# Patient Record
Sex: Female | Born: 1980 | Race: Black or African American | Hispanic: No | Marital: Single | Smoking: Never smoker
Health system: Southern US, Community
[De-identification: ages and names within clinical notes are randomized; demographics above are authoritative.]

---

## 2009-04-21 ENCOUNTER — Emergency Department (HOSPITAL_COMMUNITY): Admission: EM | Admit: 2009-04-21 | Discharge: 2009-04-21 | Payer: Self-pay | Admitting: Emergency Medicine

## 2010-05-06 IMAGING — CR DG CHEST 2V
2 series · 2 of 2 positions shown · non-contrast
Comparison: None

CLINICAL DATA: Left chest pain

CHEST - 2 VIEW

[view not recorded (1 of 2)]
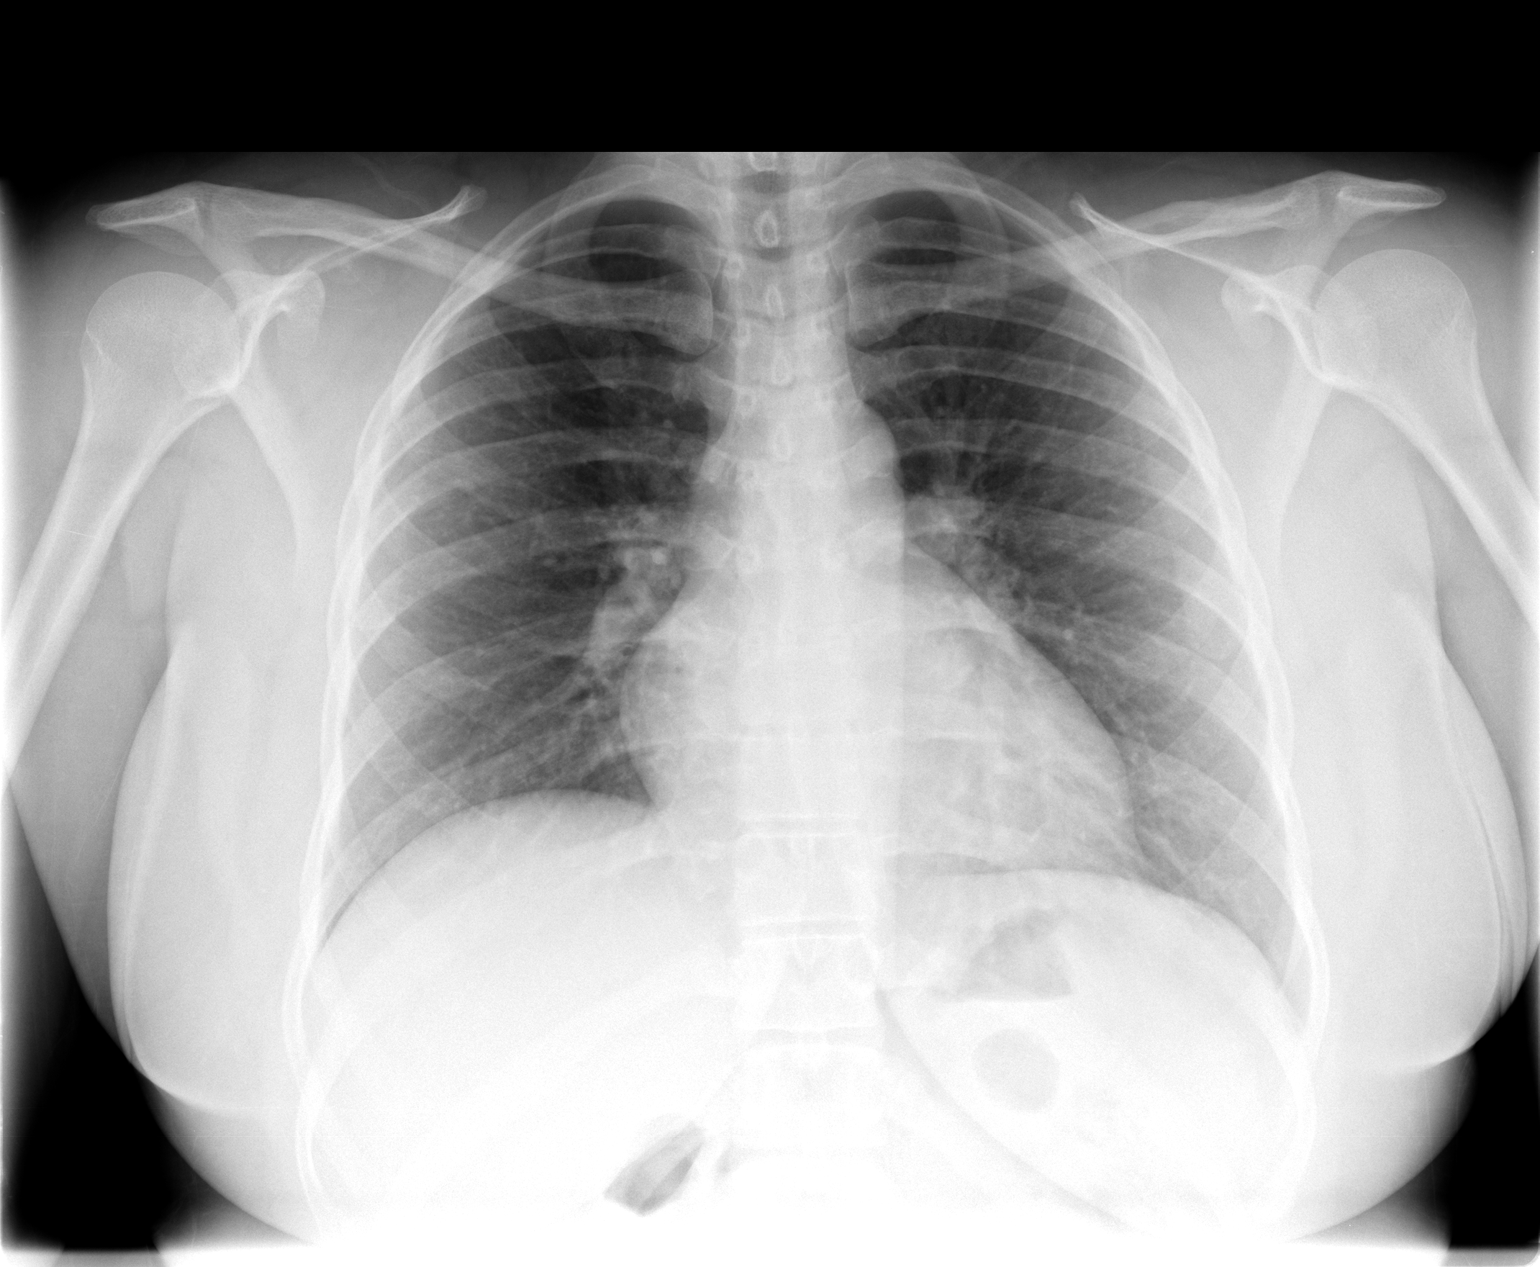

[view not recorded (2 of 2)]
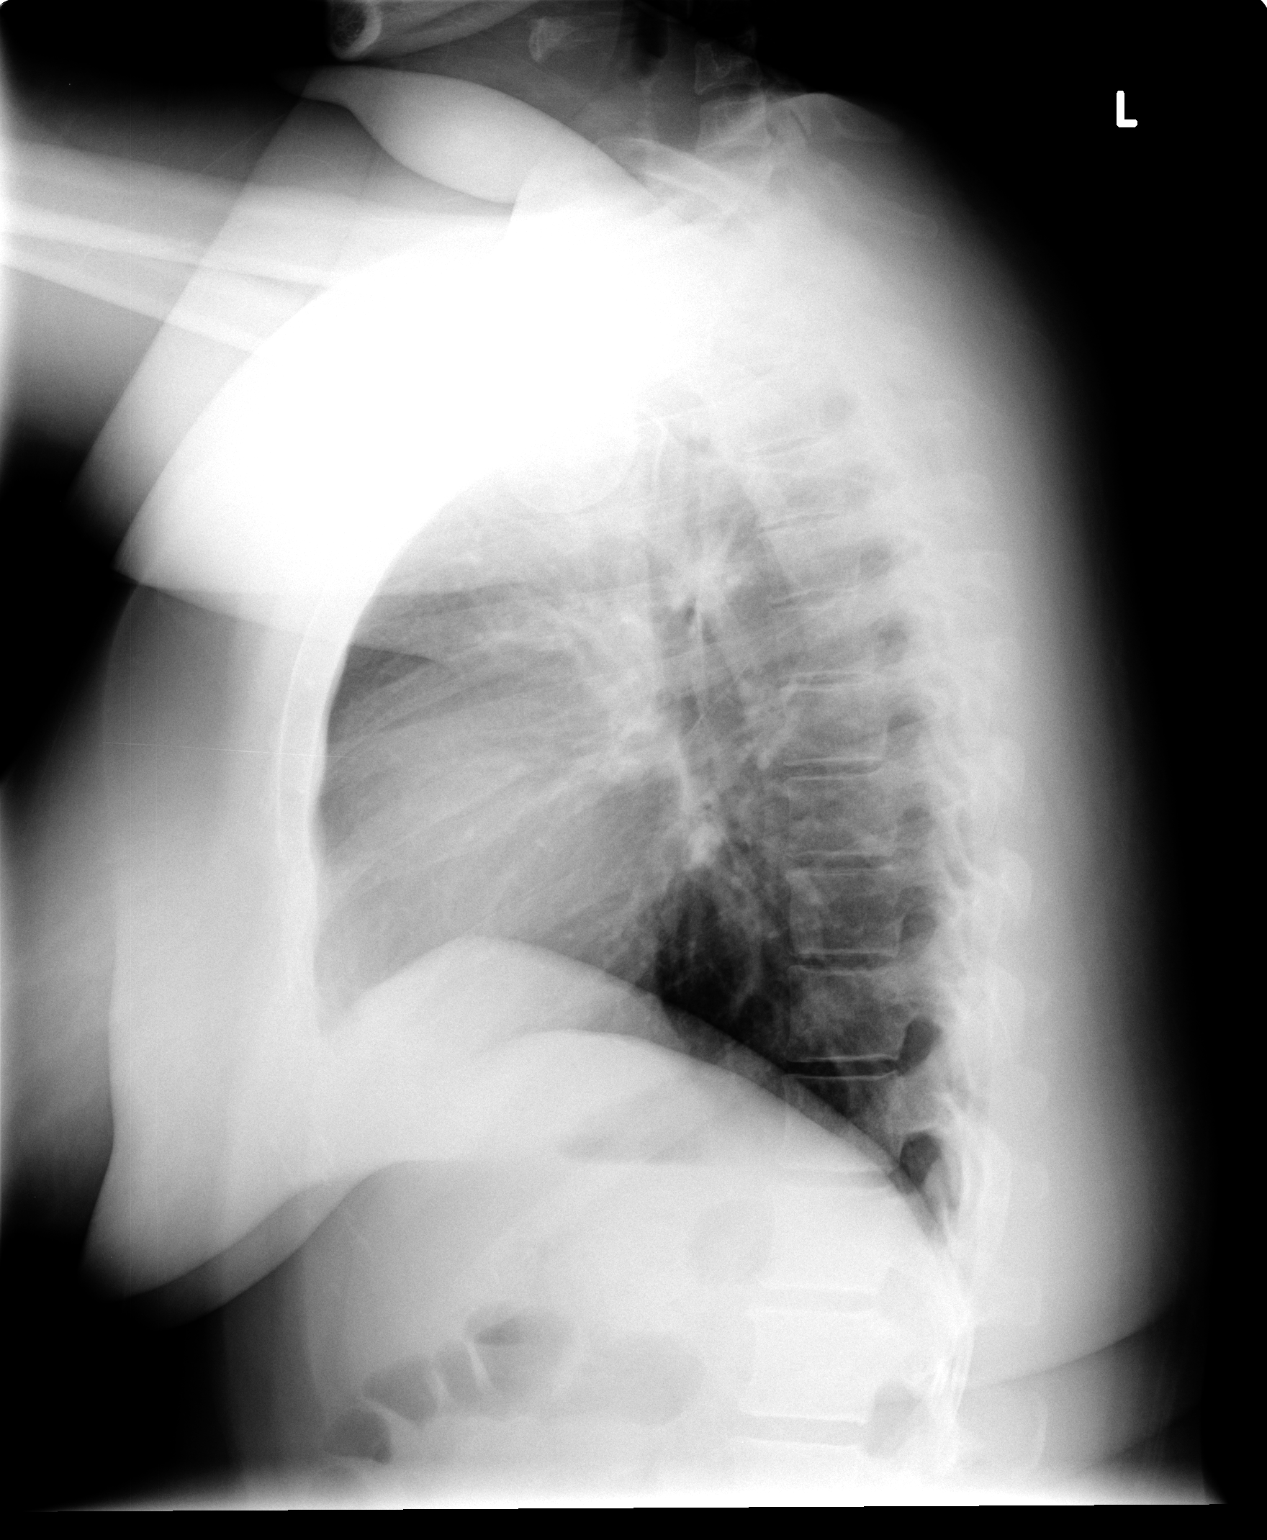

[2 of 2 positions shown; findings below may reference images not displayed]

FINDINGS: Lungs clear.  Heart size and pulmonary vascularity
normal.  No effusion.  Visualized bones unremarkable.
IMPRESSION: No acute disease

## 2012-04-01 ENCOUNTER — Other Ambulatory Visit (HOSPITAL_COMMUNITY): Payer: Self-pay | Admitting: Family Medicine

## 2012-04-01 DIAGNOSIS — N6320 Unspecified lump in the left breast, unspecified quadrant: Secondary | ICD-10-CM

## 2012-04-20 ENCOUNTER — Ambulatory Visit (HOSPITAL_COMMUNITY)
Admission: RE | Admit: 2012-04-20 | Discharge: 2012-04-20 | Disposition: A | Discharge status: Home / Self Care | Payer: Self-pay | Source: Ambulatory Visit | Attending: Family Medicine | Admitting: Family Medicine

## 2012-04-20 DIAGNOSIS — N6320 Unspecified lump in the left breast, unspecified quadrant: Secondary | ICD-10-CM

## 2012-04-20 DIAGNOSIS — N63 Unspecified lump in unspecified breast: Secondary | ICD-10-CM | POA: Insufficient documentation

## 2014-01-22 ENCOUNTER — Emergency Department (HOSPITAL_COMMUNITY)
Admission: EM | Admit: 2014-01-22 | Discharge: 2014-01-22 | Disposition: A | Discharge status: Home / Self Care | Payer: Self-pay | Attending: Emergency Medicine | Admitting: Emergency Medicine

## 2014-01-22 ENCOUNTER — Encounter (HOSPITAL_COMMUNITY): Payer: Self-pay | Admitting: Emergency Medicine

## 2014-01-22 DIAGNOSIS — J40 Bronchitis, not specified as acute or chronic: Secondary | ICD-10-CM

## 2014-01-22 DIAGNOSIS — J209 Acute bronchitis, unspecified: Secondary | ICD-10-CM | POA: Insufficient documentation

## 2014-01-22 DIAGNOSIS — R42 Dizziness and giddiness: Secondary | ICD-10-CM | POA: Insufficient documentation

## 2014-01-22 MED ORDER — ALBUTEROL SULFATE HFA 108 (90 BASE) MCG/ACT IN AERS: 1.0000 | INHALATION_SPRAY | Freq: Four times a day (QID) | RESPIRATORY_TRACT | Status: DC | PRN

## 2014-01-22 MED ORDER — HYDROCODONE-HOMATROPINE 5-1.5 MG/5ML PO SYRP: 5.0000 mL | ORAL_SOLUTION | Freq: Four times a day (QID) | ORAL | Status: DC | PRN

## 2014-01-22 MED ORDER — AZITHROMYCIN 250 MG PO TABS: ORAL_TABLET | ORAL | Status: DC

## 2014-01-22 NOTE — ED Notes (Signed)
PT c/o cough, aches, and chills X4 days. Pt states she has had a productive cough since Thursday. Cough was non-productive during assessment. Pt denies any fever or N/V at home. She denies any pain and is in no apparent distress.

## 2014-01-22 NOTE — Discharge Instructions (Signed)
Bronchitis Bronchitis is swelling (inflammation) of the air tubes leading to your lungs (bronchi). This causes mucus and a cough. If the swelling gets bad, you may have trouble breathing. HOME CARE   Rest.  Drink enough fluids to keep your pee (urine) clear or pale yellow (unless you have a condition where you have to watch how much you drink).  Only take medicine as told by your doctor. If you were given antibiotic medicines, finish them even if you start to feel better.  Avoid smoke, irritating chemicals, and strong smells. These make the problem worse. Quit smoking if you smoke. This helps your lungs heal faster.  Use a cool mist humidifier. Change the water in the humidifier every day. You can also sit in the bathroom with hot shower running for 5 10 minutes. Keep the door closed.  See your health care provider as told.  Wash your hands often. GET HELP IF: Your problems do not get better after 1 week. GET HELP RIGHT AWAY IF:   Your fever gets worse.  You have chills.  Your chest hurts.  Your problems breathing get worse.  You have blood in your mucus.  You pass out (faint).  You feel lightheaded.  You have a bad headache.  You throw up (vomit) again and again. MAKE SURE YOU:  Understand these instructions.  Will watch your condition.  Will get help right away if you are not doing well or get worse. Document Released: 04/13/2008 Document Revised: 08/16/2013 Document Reviewed: 06/20/2013 [facility] Patient Information 2014 [employer] [place].  Antibiotic, inhaler, cough syrup.   Increase fluids.   Tylenol, Ibuprofen

## 2014-01-22 NOTE — ED Notes (Signed)
Pt co cough, chest congestion, aches, and chills x 4 days.

## 2014-01-22 NOTE — ED Provider Notes (Signed)
CSN: [id number]     Arrival date & time 01/22/14  1620 History  This chart was scribed for Donnetta HutchingBrian Gregorio Worley, MD by Dorothey Basemania Sutton, ED Scribe. This patient was seen in room APA12/APA12 and the patient's care was started at 6:18 PM.    Chief Complaint  Patient presents with  . Cough   The history is provided by the patient. No language interpreter was used.   HPI Comments: Shannon Blanchard is a 33 y.o. female who presents to the Emergency Department complaining of a productive cough with yellow-green/colored sputum with associated wheezing, congestion, rhinorrhea, chills, and diffuse myalgias onset 4 days ago. She reports noticing some specks of bright red blood when she blows her nose. Patient reports an associated, diffuse headache with intermittent dizziness. She reports taking NyQuil at home without significant relief. Patient has no other pertinent medical history. Patient is a non-smoker.   History reviewed. No pertinent past medical history. History reviewed. No pertinent past surgical history. History reviewed. No pertinent family history. History  Substance Use Topics  . Smoking status: Never Smoker   . Smokeless tobacco: Not on file  . Alcohol Use: No   OB History   Grav Para Term Preterm Abortions TAB SAB Ect Mult Living                 Review of Systems  A complete 10 system review of systems was obtained and all systems are negative except as noted in the HPI and PMH.    Allergies  Review of patient's allergies indicates no known allergies.  Home Medications  No current outpatient prescriptions on file.  Triage Vitals: BP 187/83  Pulse 108  Temp(Src) 98.2 F (36.8 C) (Oral)  Resp 18  Ht 5\' 1"  (1.549 m)  Wt 170 lb (77.111 kg)  BMI 32.14 kg/m2  SpO2 100%  LMP 01/22/2014  Physical Exam  Nursing note and vitals reviewed. Constitutional: She is oriented to person, place, and time. She appears well-developed and well-nourished.  HENT:  Head: Normocephalic and  atraumatic.  Eyes: Conjunctivae and EOM are normal. Pupils are equal, round, and reactive to light.  Neck: Normal range of motion. Neck supple.  Cardiovascular: Normal rate, regular rhythm and normal heart sounds.   Pulmonary/Chest: Effort normal and breath sounds normal.  Abdominal: Soft. Bowel sounds are normal.  Musculoskeletal: Normal range of motion.  Neurological: She is alert and oriented to person, place, and time.  Skin: Skin is warm and dry.  Psychiatric: She has a normal mood and affect. Her behavior is normal.    ED Course  Procedures (including critical care time)  DIAGNOSTIC STUDIES: Oxygen Saturation is 100% on room air, normal by my interpretation.    COORDINATION OF CARE: 6:21 PM- Will discharge patient with antibiotics, an inhaler, and a cough suppressant to manage symptoms. Discussed treatment plan with patient at bedside and patient verbalized agreement.     Labs Review Labs Reviewed - No data to display Imaging Review No results found.   EKG Interpretation None      MDM   Final diagnoses:  None    History consistent with bronchitis and sinusitis. No clinical evidence of meningitis. Rx Zithromax, Hycodan cough syrup, Ventolin inhaler  I personally performed the services described in this documentation, which was scribed in my presence. The recorded information has been reviewed and is accurate.     Donnetta HutchingBrian Elisha Mcgruder, MD 01/22/14 2011

## 2017-04-17 ENCOUNTER — Emergency Department (HOSPITAL_COMMUNITY)
Admission: EM | Admit: 2017-04-17 | Discharge: 2017-04-17 | Disposition: A | Discharge status: Home / Self Care | Payer: Self-pay | Attending: Emergency Medicine | Admitting: Emergency Medicine

## 2017-04-17 ENCOUNTER — Encounter (HOSPITAL_COMMUNITY): Payer: Self-pay | Admitting: Emergency Medicine

## 2017-04-17 DIAGNOSIS — H10212 Acute toxic conjunctivitis, left eye: Secondary | ICD-10-CM | POA: Insufficient documentation

## 2017-04-17 DIAGNOSIS — T65891A Toxic effect of other specified substances, accidental (unintentional), initial encounter: Secondary | ICD-10-CM | POA: Insufficient documentation

## 2017-04-17 LAB — CBG MONITORING, ED: GLUCOSE-CAPILLARY: 101 mg/dL — AB (ref 65–99)

## 2017-04-17 MED ORDER — FLUORESCEIN SODIUM 0.6 MG OP STRP
1.0000 | ORAL_STRIP | Freq: Once | OPHTHALMIC | Status: AC
  Administered 2017-04-17: 1 via OPHTHALMIC
  Filled 2017-04-17: qty 1

## 2017-04-17 MED ORDER — TETRACAINE HCL 0.5 % OP SOLN
2.0000 [drp] | Freq: Once | OPHTHALMIC | Status: AC
  Administered 2017-04-17: 2 [drp] via OPHTHALMIC
  Filled 2017-04-17: qty 4

## 2017-04-17 NOTE — ED Notes (Signed)
Morgan lens applied to left eye with 500cc flush NS per verbal order H. Beverely PaceBryant, [place]

## 2017-04-17 NOTE — Discharge Instructions (Signed)
Your blood pressure is elevated at 167/88. Please have this rechecked by your primary MD, or [facility]. Your glucose is only minimally elevated. There no burns, HIS, or scratches on your cornea. I suspect that you have a mild chemical conjunctivitis. Please apply cool compresses to your eye 3 or 4 times daily. Please use artificial tears from the pharmacy 3 or 4 times daily until this has resolved. Please see your eye specialist or return to the emergency department if not improving.

## 2017-04-17 NOTE — ED Provider Notes (Signed)
[facility] Provider Note   CSN: [id number] Arrival date & time: 04/17/17  0808     History   Chief Complaint Chief Complaint  Patient presents with  . Eye Problem    HPI Shannon Blanchard is a 36 y.o. female.  Patient is a 36 year old female who presents to the emergency department with a complaint of an eye problem.  The patient states that she was using a new solution in her hair. Some of the guidewire, she had burning in her left eye in particular. She initially just tried to use a towel to get it out. When this was unsuccessful, she washed her face to try to get the extra chemical out of her eye. The patient states that she did not rest well during the night because of discomfort. This morning the patient was looking at her eye pad while under the dryer. She states that she was able to see anything on her eye pad okay. She was also able to see objects in the room without problem. When the lights were low, she felt as though there was "haze" in her field of vision. She tried additional washing of her face, she tried some over-the-counter eyedrops, the haze did not get better and she came to the emergency department for additional evaluation. The patient is not had any previous problems with her eyes. She has never been told that she had any glaucoma related illnesses. She denies diabetes. She has not been not diagnosed or treated for hypertension issues. She denies any problems to the right eye. She presents now for assistance with this issue.   The history is provided by the patient.  Eye Problem   Associated symptoms include photophobia. Pertinent negatives include no numbness, no discharge, no nausea, no vomiting and no weakness.    History reviewed. No pertinent past medical history.  There are no active problems to display for this patient.   History reviewed. No pertinent surgical history.  OB History    No data available       Home Medications    Prior  to Admission medications   Medication Sig Start Date End Date Taking? Authorizing Provider  albuterol (PROVENTIL HFA;VENTOLIN HFA) 108 (90 BASE) MCG/ACT inhaler Inhale 1-2 puffs into the lungs every 6 (six) hours as needed for wheezing or shortness of breath. 01/22/14   Donnetta Hutching, MD  azithromycin (ZITHROMAX Z-PAK) 250 MG tablet 2 po day one, then 1 daily x 4 days 01/22/14   Donnetta Hutching, MD  Chlorpheniramine-DM (COUGH & COLD PO) Take 10 mLs by mouth daily as needed (for cold and cough symptoms).    [provider]  HYDROcodone-homatropine (HYCODAN) 5-1.5 MG/5ML syrup Take 5 mLs by mouth every 6 (six) hours as needed for cough. 01/22/14   Donnetta Hutching, MD  Multiple Vitamins-Minerals (MULTIVITAMIN GUMMIES ADULT PO) Take by mouth daily.    [provider]    Family History No family history on file.  Social History Social History  Substance Use Topics  . Smoking status: Never Smoker  . Smokeless tobacco: Never Used  . Alcohol use No     Allergies   Patient has no known allergies.   Review of Systems Review of Systems  Constitutional: Negative for activity change and appetite change.  HENT: Negative for congestion, ear discharge, ear pain, facial swelling, nosebleeds, rhinorrhea, sneezing and tinnitus.   Eyes: Positive for photophobia and visual disturbance. Negative for pain and discharge.  Respiratory: Negative for cough, choking, shortness of  breath and wheezing.   Cardiovascular: Negative for chest pain, palpitations and leg swelling.  Gastrointestinal: Negative for abdominal pain, blood in stool, constipation, diarrhea, nausea and vomiting.  Genitourinary: Negative for difficulty urinating, dysuria, flank pain, frequency and hematuria.  Musculoskeletal: Negative for back pain, gait problem, myalgias and neck pain.  Skin: Negative for color change, rash and wound.  Neurological: Negative for dizziness, seizures, syncope, facial asymmetry, speech difficulty,  weakness, numbness and headaches.  Hematological: Negative for adenopathy. Does not bruise/bleed easily.  Psychiatric/Behavioral: Negative for agitation, confusion, hallucinations, self-injury and suicidal ideas. The patient is not nervous/anxious.      Physical Exam Updated Vital Signs BP (!) 167/88   Pulse (!) 112   Temp 98.3 F (36.8 C) (Oral)   Resp 18   Ht 5' (1.524 m)   Wt 81.6 kg (180 lb)   LMP 04/06/2017   SpO2 98%   BMI 35.15 kg/m   Physical Exam  Constitutional: Vital signs are normal. She appears well-developed and well-nourished. She is active.  HENT:  Head: Normocephalic and atraumatic.  Right Ear: Tympanic membrane, external ear and ear canal normal.  Left Ear: Tympanic membrane, external ear and ear canal normal.  Nose: Nose normal.  Mouth/Throat: Uvula is midline, oropharynx is clear and moist and mucous membranes are normal.  Eyes: Conjunctivae, EOM and lids are normal. Pupils are equal, round, and reactive to light.  There is no increased redness around the right or left orbit areas. The anterior chambers on the right and the left clear. The extra ocular movements are intact. Anterior chambers clear.  On funduscopic examination, there is no hemorrhage or exudate appreciated. There is no edema appreciated.  On fluorescein examination, there is no scratch, ulcer, or burn appreciated.  Neck: Trachea normal, normal range of motion and phonation normal. Neck supple. Carotid bruit is not present.  Cardiovascular: Normal rate, regular rhythm and normal pulses.   Abdominal: Soft. Normal appearance and bowel sounds are normal.  Lymphadenopathy:       Head (right side): No submental, no preauricular and no posterior auricular adenopathy present.       Head (left side): No submental, no preauricular and no posterior auricular adenopathy present.    She has no cervical adenopathy.  Neurological: She is alert. She has normal strength. No cranial nerve deficit or sensory  deficit. GCS eye subscore is 4. GCS verbal subscore is 5. GCS motor subscore is 6.  Skin: Skin is warm and dry.  Psychiatric: Her speech is normal.     ED Treatments / Results  Labs (all labs ordered are listed, but only abnormal results are displayed) Labs Reviewed  CBG MONITORING, ED - Abnormal; Notable for the following:       Result Value   Glucose-Capillary 101 (*)    All other components within normal limits    EKG  EKG Interpretation None       Radiology No results found.  Procedures Procedures (including critical care time)  Medications Ordered in ED Medications  fluorescein ophthalmic strip 1 strip (not administered)  tetracaine (PONTOCAINE) 0.5 % ophthalmic solution 2 drop (not administered)     Initial Impression / Assessment and Plan / ED Course  I have reviewed the triage vital signs and the nursing notes.  Pertinent labs & imaging results that were available during my care of the patient were reviewed by me and considered in my medical decision making (see chart for details).       Final Clinical Impressions(s) /  ED Diagnoses MDM Visual acuity reviewed. Patient is 20/20 in both eyes. Capillary blood glucose checked, and this is not elevated. Funduscopic examination is negative for chemical burns, ulcers, or any scratches to the cornea.  Suspect a chemical conjunctivitis. Patient states she's had increased mucus present. Patient irrigated with Lequita HaltMorgan lens. Patient states the patient seems to be clear. The patient will use cool compresses to the eye. The patient will see ophthalmology or return to the emergency department if not improving.    Final diagnoses:  Chemical conjunctivitis of left eye    New Prescriptions New Prescriptions   No medications on file     Duayne CalBryant, Myley Bahner, PA-C 04/19/17 1041    Eber HongMiller, Brian, MD 04/20/17 [phone]

## 2017-04-17 NOTE — ED Triage Notes (Signed)
Pt reports she put a solution in her hair and was sitting under the hair dryer when she noticed a "haze" over her left eye. Pt denies pain, but feels very dry. Eye does not appear red or swollen.

## 2020-03-20 ENCOUNTER — Ambulatory Visit: Payer: Self-pay | Admitting: Nurse Practitioner

## 2020-05-07 ENCOUNTER — Encounter: Payer: Self-pay | Admitting: Nurse Practitioner

## 2020-05-07 ENCOUNTER — Ambulatory Visit (INDEPENDENT_AMBULATORY_CARE_PROVIDER_SITE_OTHER): Payer: Commercial Managed Care - PPO | Admitting: Nurse Practitioner

## 2020-05-07 ENCOUNTER — Other Ambulatory Visit: Payer: Self-pay

## 2020-05-07 VITALS — BP 142/98 | HR 98 | Temp 98.9°F | Ht 62.2 in | Wt 178.4 lb

## 2020-05-07 DIAGNOSIS — Z1159 Encounter for screening for other viral diseases: Secondary | ICD-10-CM | POA: Diagnosis not present

## 2020-05-07 DIAGNOSIS — R5383 Other fatigue: Secondary | ICD-10-CM | POA: Diagnosis not present

## 2020-05-07 MED ORDER — MAGNESIUM 200 MG PO TABS: 1.0000 | ORAL_TABLET | Freq: Every day | ORAL | 2 refills | Status: AC

## 2020-05-07 NOTE — Progress Notes (Signed)
This visit occurred during the SARS-CoV-2 public health emergency.  Safety protocols were in place, including screening questions prior to the visit, additional usage of staff PPE, and extensive cleaning of exam room while observing appropriate contact time as indicated for disinfecting solutions.  Subjective:     Patient ID: Shannon Blanchard , female    DOB: Feb 09, 1981 , 39 y.o.   MRN: [id number]   Chief Complaint  Patient presents with  . Establish Care  . Edema    patient stated her feet are swelling    HPI  Here to establish care and was referred by her sister Shannon Blanchard.  She was going to Dr. Berdine Addison at the [facility], last seen couple months ago by the nurse practitioner. Dr. Berdine Addison is getting ready to retire and relocated to [place].  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 Single. No children.  No GYN, LMP 04/22/2020 - regular.  She has never had a PAP and never been sexually active. Mother with cyst on her ovaries.  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  She was exercising more regularly.  She was drinking approximately 6 bottles of water.  She feels like she has gained weight as well. She graduated from [employer].    PMH - healthy.    [facility] - mother - prediabetes, hypertension, maternal grandmother - diabetes. Brother - healthy.   She reports she has OCD and unable to go to sleep until she has everything done  Insomnia Primary symptoms: no fragmented sleep, no sleep disturbance, no difficulty falling asleep, frequent awakening.      History reviewed. No pertinent past medical history.   Family History  Problem Relation Age of Onset  . Hypertension Mother   . Hypertension Father   . Diabetes Maternal Grandmother   . Hypertension Maternal Grandmother      Current Outpatient Medications:  Marland Kitchen  Multiple Vitamins-Minerals (MULTIVITAMIN GUMMIES ADULT PO), Take by mouth daily., Disp: , Rfl:    No  Known Allergies   Review of Systems  Constitutional: Negative.   Respiratory: Negative.   Cardiovascular: Negative.  Negative for chest pain, palpitations and leg swelling.  Neurological: Negative for dizziness and headaches.  Psychiatric/Behavioral: Negative for sleep disturbance. The patient has insomnia.      Today's Vitals   05/07/20 1524  BP: (!) 142/98  Pulse: 98  Temp: 98.9 F (37.2 C)  TempSrc: Oral  Weight: 178 lb 6.4 oz (80.9 kg)  Height: 5' 2.2" (1.58 m)  PainSc: 0-No pain   Body mass index is 32.42 kg/m.   Objective:  Physical Exam Vitals reviewed.  Constitutional:      General: She is not in acute distress.    Appearance: Normal appearance.  Cardiovascular:     Rate and Rhythm: Normal rate and regular rhythm.     Pulses: Normal pulses.     Heart sounds: Normal heart sounds. No murmur heard.   Pulmonary:     Effort: Pulmonary effort is normal. No respiratory distress.     Breath sounds: Normal breath sounds.  Musculoskeletal:        General: No swelling or tenderness.  Skin:    Capillary Refill: Capillary refill takes less than 2 seconds.  Neurological:     General: No focal deficit present.     Mental Status: She is alert and oriented to person, place, and time.     Cranial Nerves: No cranial nerve deficit.  Psychiatric:        Mood and Affect: Mood normal.        Behavior: Behavior normal.        Thought Content: Thought content normal.        Judgment: Judgment normal.         Assessment And Plan:     1. Fatigue, unspecified type  Will check for metabolic cause  She does have a busy lifestyle which can be contributing to her fatigue.  Offered to refer to psychology/psychiatry for her obsessive compulsive behaviors she describes. - Vitamin B12 - TSH - CBC - CMP14+EGFR  2. Encounter for hepatitis C screening test for low risk patient  Will check Hepatitis C screening due to recent recommendations to screen all adults 18 years and  older - Hepatitis C antibody   Minette Brine, FNP    THE PATIENT IS ENCOURAGED TO PRACTICE SOCIAL DISTANCING DUE TO THE COVID-19 PANDEMIC.

## 2020-05-08 LAB — CMP14+EGFR
ALT: 11 IU/L (ref 0–32)
AST: 14 IU/L (ref 0–40)
Albumin/Globulin Ratio: 1.2 (ref 1.2–2.2)
Albumin: 4 g/dL (ref 3.8–4.8)
Alkaline Phosphatase: 41 IU/L — ABNORMAL LOW (ref 48–121)
BUN/Creatinine Ratio: 10 (ref 9–23)
BUN: 8 mg/dL (ref 6–24)
Bilirubin Total: 0.6 mg/dL (ref 0.0–1.2)
CO2: 22 mmol/L (ref 20–29)
Calcium: 9.1 mg/dL (ref 8.7–10.2)
Chloride: 104 mmol/L (ref 96–106)
Creatinine, Ser: 0.78 mg/dL (ref 0.57–1.00)
GFR calc Af Amer: 109 mL/min/{1.73_m2} (ref >59)
GFR calc non Af Amer: 95 mL/min/{1.73_m2} (ref >59)
Globulin, Total: 3.3 g/dL (ref 1.5–4.5)
Glucose: 78 mg/dL (ref 65–99)
Potassium: 4 mmol/L (ref 3.5–5.2)
Sodium: 138 mmol/L (ref 134–144)
Total Protein: 7.3 g/dL (ref 6.0–8.5)

## 2020-05-08 LAB — TSH: TSH: 1.36 u[IU]/mL (ref 0.450–4.500)

## 2020-05-08 LAB — VITAMIN B12: Vitamin B-12: 326 pg/mL (ref 232–1245)

## 2020-05-08 LAB — CBC
Hematocrit: 34.7 % (ref 34.0–46.6)
Hemoglobin: 11.1 g/dL (ref 11.1–15.9)
MCH: 24.3 pg — ABNORMAL LOW (ref 26.6–33.0)
MCHC: 32 g/dL (ref 31.5–35.7)
MCV: 76 fL — ABNORMAL LOW (ref 79–97)
Platelets: 269 10*3/uL (ref 150–450)
RBC: 4.57 x10E6/uL (ref 3.77–5.28)
RDW: 14.2 % (ref 11.7–15.4)
WBC: 5.4 10*3/uL (ref 3.4–10.8)

## 2020-05-08 LAB — HEPATITIS C ANTIBODY: Hep C Virus Ab: <0.1 s/co ratio (ref 0.0–0.9)

## 2020-05-21 ENCOUNTER — Telehealth: Payer: Self-pay

## 2020-05-21 NOTE — Telephone Encounter (Signed)
Patient notified that her labs were essentially normal and she stated she no longer has any swelling in her legs. Pt declined to set up mychart at this time. YL,RMA

## 2020-07-02 ENCOUNTER — Ambulatory Visit: Payer: Commercial Managed Care - PPO | Admitting: Nurse Practitioner

## 2020-07-25 ENCOUNTER — Ambulatory Visit (INDEPENDENT_AMBULATORY_CARE_PROVIDER_SITE_OTHER): Payer: Commercial Managed Care - PPO | Admitting: Nurse Practitioner

## 2020-07-25 ENCOUNTER — Other Ambulatory Visit: Payer: Self-pay

## 2020-07-25 ENCOUNTER — Encounter: Payer: Self-pay | Admitting: Nurse Practitioner

## 2020-07-25 VITALS — BP 152/78 | HR 73 | Temp 98.0°F | Ht 62.2 in | Wt 179.4 lb

## 2020-07-25 DIAGNOSIS — R009 Unspecified abnormalities of heart beat: Secondary | ICD-10-CM | POA: Diagnosis not present

## 2020-07-25 DIAGNOSIS — G4700 Insomnia, unspecified: Secondary | ICD-10-CM | POA: Diagnosis not present

## 2020-07-25 DIAGNOSIS — R030 Elevated blood-pressure reading, without diagnosis of hypertension: Secondary | ICD-10-CM

## 2020-07-25 NOTE — Patient Instructions (Signed)

## 2020-07-25 NOTE — Progress Notes (Signed)
I,Yamilka Roman [employer] as a Neurosurgeon for [employer], FNP.,have documented all relevant documentation on the behalf of Arnette Felts, FNP,as directed by  Arnette Felts, FNP while in the presence of Arnette Felts, FNP. This visit occurred during the SARS-CoV-2 public health emergency.  Safety protocols were in place, including screening questions prior to the visit, additional usage of staff PPE, and extensive cleaning of exam room while observing appropriate contact time as indicated for disinfecting solutions.  Subjective:     Patient ID: Shannon Blanchard , female    DOB: 02-Oct-1981 , 39 y.o.   MRN: [id number]   Chief Complaint  Patient presents with  . Insomnia    HPI  Her blood pressure is elevated today in the office.  Left arm 125/75 Right arm 131/73.  She feels when she comes to the doctor she will have anxiousness. She had been on a blood pressure medications but it made her sleepy.   She is currently planning to go to [employer] school and is able to waive two classes.  She reports her sleep is better.     Wt Readings from Last 3 Encounters: 07/25/20 : 179 lb 6.4 oz (81.4 kg) 05/07/20 : 178 lb 6.4 oz (80.9 kg) 04/17/17 : 180 lb (81.6 kg)     History reviewed. No pertinent past medical history.   Family History  Problem Relation Age of Onset  . Hypertension Mother   . Hypertension Father   . Diabetes Maternal Grandmother   . Hypertension Maternal Grandmother      Current Outpatient Medications:  Marland Kitchen  Magnesium 200 MG TABS, Take 1 tablet (200 mg total) by mouth daily. Take with evening meal, Disp: 30 tablet, Rfl: 2 .  Multiple Vitamins-Minerals (MULTIVITAMIN GUMMIES ADULT PO), Take by mouth daily., Disp: , Rfl:    No Known Allergies   Review of Systems  Constitutional: Negative.   Respiratory: Negative.   Cardiovascular: Negative for chest pain, palpitations and leg swelling.  Neurological: Negative for dizziness and headaches.  Psychiatric/Behavioral: Negative  for agitation and sleep disturbance. The patient is not nervous/anxious.      Today's Vitals   07/25/20 1449  BP: (!) 152/78  Pulse: 73  Temp: 98 F (36.7 C)  TempSrc: Oral  Weight: 179 lb 6.4 oz (81.4 kg)  Height: 5' 2.2" (1.58 m)  PainSc: 3   PainLoc: Head   Body mass index is 32.6 kg/m.   Objective:  Physical Exam Constitutional:      General: She is not in acute distress.    Appearance: Normal appearance.  Cardiovascular:     Rate and Rhythm: Normal rate and regular rhythm.  Neurological:     General: No focal deficit present.     Mental Status: She is alert and oriented to person, place, and time.     Cranial Nerves: No cranial nerve deficit.  Psychiatric:        Mood and Affect: Mood normal.        Behavior: Behavior normal.        Thought Content: Thought content normal.        Judgment: Judgment normal.         Assessment And Plan:     1. Insomnia, unspecified type  Reports she is sleeping better and does not want to take any medications at this time.  2. Elevated heart rate with elevated blood pressure without diagnosis of hypertension  Blood pressure is elevated, she feels this may be up due to how she  eats and would like a referral to a nutritionist.    Stay well hydrated with water.  - Amb ref to Medical Nutrition Therapy-MNT     Patient was given opportunity to ask questions. Patient verbalized understanding of the plan and was able to repeat key elements of the plan. All questions were answered to their satisfaction.  Arnette Felts, FNP   I, Arnette Felts, FNP, have reviewed all documentation for this visit. The documentation on 07/28/20 for the exam, diagnosis, procedures, and orders are all accurate and complete.  THE PATIENT IS ENCOURAGED TO PRACTICE SOCIAL DISTANCING DUE TO THE COVID-19 PANDEMIC.

## 2020-09-09 ENCOUNTER — Encounter: Payer: Commercial Managed Care - PPO | Admitting: Skilled Nursing Facility1

## 2020-10-29 ENCOUNTER — Encounter: Payer: Commercial Managed Care - PPO | Admitting: Nurse Practitioner
